# Patient Record
Sex: Male | Born: 2000 | Race: Black or African American | Hispanic: No | Marital: Single | Smoking: Never smoker
Health system: Southern US, Community
[De-identification: ages and names within clinical notes are randomized; demographics above are authoritative.]

## PROBLEM LIST (undated history)

## (undated) DIAGNOSIS — R51 Headache: Secondary | ICD-10-CM

## (undated) HISTORY — DX: Headache: R51

## (undated) HISTORY — PX: CIRCUMCISION: SHX1350

---
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
## 2019-03-05 ENCOUNTER — Other Ambulatory Visit: Payer: Self-pay

## 2019-03-05 DIAGNOSIS — Z20822 Contact with and (suspected) exposure to covid-19: Secondary | ICD-10-CM

## 2019-03-07 LAB — NOVEL CORONAVIRUS, NAA: SARS-CoV-2, NAA: NOT DETECTED
